# Patient Record
Sex: Female | Born: 1975 | Race: White | Hispanic: No | Marital: Married | Smoking: Never smoker
Health system: Southern US, Community
[De-identification: ages and names within clinical notes are randomized; demographics above are authoritative.]

## PROBLEM LIST (undated history)

## (undated) DIAGNOSIS — I7300 Raynaud's syndrome without gangrene: Secondary | ICD-10-CM

## (undated) DIAGNOSIS — Z87898 Personal history of other specified conditions: Secondary | ICD-10-CM

## (undated) HISTORY — DX: Personal history of other specified conditions: Z87.898

## (undated) HISTORY — PX: COSMETIC SURGERY: SHX468

---

## 2005-06-22 ENCOUNTER — Other Ambulatory Visit: Admission: RE | Admit: 2005-06-22 | Discharge: 2005-06-22 | Payer: Self-pay | Admitting: Obstetrics and Gynecology

## 2005-06-23 ENCOUNTER — Other Ambulatory Visit: Admission: RE | Admit: 2005-06-23 | Discharge: 2005-06-23 | Payer: Self-pay | Admitting: Obstetrics and Gynecology

## 2008-10-11 ENCOUNTER — Inpatient Hospital Stay (HOSPITAL_COMMUNITY): Admission: AD | Admit: 2008-10-11 | Discharge: 2008-10-13 | Payer: Self-pay | Admitting: Obstetrics & Gynecology

## 2010-07-17 ENCOUNTER — Inpatient Hospital Stay (HOSPITAL_COMMUNITY): Admission: AD | Admit: 2010-07-17 | Discharge: 2010-07-19 | Payer: Self-pay | Admitting: Family Medicine

## 2010-12-22 LAB — CBC
HCT: 34 % — ABNORMAL LOW (ref 36.0–46.0)
HCT: 39.5 % (ref 36.0–46.0)
Hemoglobin: 11.7 g/dL — ABNORMAL LOW (ref 12.0–15.0)
Hemoglobin: 13.4 g/dL (ref 12.0–15.0)
MCH: 31 pg (ref 26.0–34.0)
MCH: 31.4 pg (ref 26.0–34.0)
MCHC: 34.3 g/dL (ref 30.0–36.0)
MCV: 91.4 fL (ref 78.0–100.0)
MCV: 91.7 fL (ref 78.0–100.0)
Platelets: 240 10*3/uL (ref 150–400)
RBC: 3.71 MIL/uL — ABNORMAL LOW (ref 3.87–5.11)
RBC: 4.32 MIL/uL (ref 3.87–5.11)

## 2011-01-23 LAB — CBC
HCT: 26.1 % — ABNORMAL LOW (ref 36.0–46.0)
MCHC: 34.8 g/dL (ref 30.0–36.0)
MCHC: 37.7 g/dL — ABNORMAL HIGH (ref 30.0–36.0)
MCV: 92.1 fL (ref 78.0–100.0)
Platelets: 120 10*3/uL — ABNORMAL LOW (ref 150–400)
Platelets: 166 10*3/uL (ref 150–400)
RDW: 13.9 % (ref 11.5–15.5)

## 2011-01-23 LAB — RPR: RPR Ser Ql: NONREACTIVE

## 2013-03-25 ENCOUNTER — Ambulatory Visit: Payer: Self-pay | Admitting: Physician Assistant

## 2015-06-17 ENCOUNTER — Ambulatory Visit (INDEPENDENT_AMBULATORY_CARE_PROVIDER_SITE_OTHER): Payer: Managed Care, Other (non HMO) | Admitting: Family Medicine

## 2015-06-17 VITALS — BP 112/62 | HR 64 | Temp 98.8°F | Resp 18 | Ht 63.5 in | Wt 126.0 lb

## 2015-06-17 DIAGNOSIS — Z20818 Contact with and (suspected) exposure to other bacterial communicable diseases: Secondary | ICD-10-CM

## 2015-06-17 DIAGNOSIS — M791 Myalgia, unspecified site: Secondary | ICD-10-CM

## 2015-06-17 DIAGNOSIS — R509 Fever, unspecified: Secondary | ICD-10-CM | POA: Diagnosis not present

## 2015-06-17 DIAGNOSIS — J029 Acute pharyngitis, unspecified: Secondary | ICD-10-CM

## 2015-06-17 DIAGNOSIS — R350 Frequency of micturition: Secondary | ICD-10-CM

## 2015-06-17 DIAGNOSIS — Z2089 Contact with and (suspected) exposure to other communicable diseases: Secondary | ICD-10-CM

## 2015-06-17 LAB — POCT UA - MICROSCOPIC ONLY
Bacteria, U Microscopic: NEGATIVE
Casts, Ur, LPF, POC: NEGATIVE
Crystals, Ur, HPF, POC: NEGATIVE
Mucus, UA: NEGATIVE
YEAST UA: NEGATIVE

## 2015-06-17 LAB — POCT URINALYSIS DIPSTICK
Bilirubin, UA: NEGATIVE
Glucose, UA: NEGATIVE
Ketones, UA: NEGATIVE
Leukocytes, UA: NEGATIVE
NITRITE UA: NEGATIVE
PH UA: 7
Spec Grav, UA: 1.02
UROBILINOGEN UA: 0.2

## 2015-06-17 LAB — POCT INFLUENZA A/B
INFLUENZA A, POC: NEGATIVE
INFLUENZA B, POC: NEGATIVE

## 2015-06-17 MED ORDER — AMOXICILLIN 875 MG PO TABS: 875.0000 mg | ORAL_TABLET | Freq: Two times a day (BID) | ORAL | Status: DC

## 2015-06-17 NOTE — Patient Instructions (Signed)
probable strep throat with contact and fever/lympadenopathy.  Fluids, lozenges as needed. Ibuprofen for fever/bodyaches. Let me know if you worsen over the weekend - I am in office both days. I will call you about urine and flu tests today.   Return to the clinic or go to the nearest emergency room if any of your symptoms worsen or new symptoms occur.  Fever, Adult A fever is a higher than normal body temperature. In an adult, an oral temperature around 98.6 F (37 C) is considered normal. A temperature of 100.4 F (38 C) or higher is generally considered a fever. Mild or moderate fevers generally have no long-term effects and often do not require treatment. Extreme fever (greater than or equal to 106 F or 41.1 C) can cause seizures. The sweating that may occur with repeated or prolonged fever may cause dehydration. Elderly people can develop confusion during a fever. A measured temperature can vary with:  Age.  Time of day.  Method of measurement (mouth, underarm, rectal, or ear). The fever is confirmed by taking a temperature with a thermometer. Temperatures can be taken different ways. Some methods are accurate and some are not.  An oral temperature is used most commonly. Electronic thermometers are fast and accurate.  An ear temperature will only be accurate if the thermometer is positioned as recommended by the manufacturer.  A rectal temperature is accurate and done for those adults who have a condition where an oral temperature cannot be taken.  An underarm (axillary) temperature is not accurate and not recommended. Fever is a symptom, not a disease.  CAUSES   Infections commonly cause fever.  Some noninfectious causes for fever include:  Some arthritis conditions.  Some thyroid or adrenal gland conditions.  Some immune system conditions.  Some types of cancer.  A medicine reaction.  High doses of certain street drugs such as  methamphetamine.  Dehydration.  Exposure to high outside or room temperatures.  Occasionally, the source of a fever cannot be determined. This is sometimes called a "fever of unknown origin" (FUO).  Some situations may lead to a temporary rise in body temperature that may go away on its own. Examples are:  Childbirth.  Surgery.  Intense exercise. HOME CARE INSTRUCTIONS   Take appropriate medicines for fever. Follow dosing instructions carefully. If you use acetaminophen to reduce the fever, be careful to avoid taking other medicines that also contain acetaminophen. Do not take aspirin for a fever if you are younger than age 33. There is an association with Reye's syndrome. Reye's syndrome is a rare but potentially deadly disease.  If an infection is present and antibiotics have been prescribed, take them as directed. Finish them even if you start to feel better.  Rest as needed.  Maintain an adequate fluid intake. To prevent dehydration during an illness with prolonged or recurrent fever, you may need to drink extra fluid.Drink enough fluids to keep your urine clear or pale yellow.  Sponging or bathing with room temperature water may help reduce body temperature. Do not use ice water or alcohol sponge baths.  Dress comfortably, but do not over-bundle. SEEK MEDICAL CARE IF:   You are unable to keep fluids down.  You develop vomiting or diarrhea.  You are not feeling at least partly better after 3 days.  You develop new symptoms or problems. SEEK IMMEDIATE MEDICAL CARE IF:   You have shortness of breath or trouble breathing.  You develop excessive weakness.  You are dizzy or you  faint.  You are extremely thirsty or you are making little or no urine.  You develop new pain that was not there before (such as in the head, neck, chest, back, or abdomen).  You have persistent vomiting and diarrhea for more than 1 to 2 days.  You develop a stiff neck or your eyes become  sensitive to light.  You develop a skin rash.  You have a fever or persistent symptoms for more than 2 to 3 days.  You have a fever and your symptoms suddenly get worse. MAKE SURE YOU:   Understand these instructions.  Will watch your condition.  Will get help right away if you are not doing well or get worse. Document Released: 03/21/2001 Document Revised: 02/09/2014 Document Reviewed: 07/27/2011 [facility] Patient Information 2015 [place], [place]. This information is not intended to replace advice given to you by your health care provider. Make sure you discuss any questions you have with your health care provider.   Strep Throat Strep throat is an infection of the throat caused by a bacteria named Streptococcus pyogenes. Your health care provider may call the infection streptococcal "tonsillitis" or "pharyngitis" depending on whether there are signs of inflammation in the tonsils or back of the throat. Strep throat is most common in children aged 5-15 years during the cold months of the year, but it can occur in people of any age during any season. This infection is spread from person to person (contagious) through coughing, sneezing, or other close contact. SIGNS AND SYMPTOMS   Fever or chills.  Painful, swollen, red tonsils or throat.  Pain or difficulty when swallowing.  White or yellow spots on the tonsils or throat.  Swollen, tender lymph nodes or "glands" of the neck or under the jaw.  Red rash all over the body (rare). DIAGNOSIS  Many different infections can cause the same symptoms. A test must be done to confirm the diagnosis so the right treatment can be given. A "rapid strep test" can help your health care provider make the diagnosis in a few minutes. If this test is not available, a light swab of the infected area can be used for a throat culture test. If a throat culture test is done, results are usually available in a day or two. TREATMENT  Strep throat is treated  with antibiotic medicine. HOME CARE INSTRUCTIONS   Gargle with 1 tsp of salt in 1 cup of warm water, 3-4 times per day or as needed for comfort.  Family members who also have a sore throat or fever should be tested for strep throat and treated with antibiotics if they have the strep infection.  Make sure everyone in your household washes their hands well.  Do not share food, drinking cups, or personal items that could cause the infection to spread to others.  You may need to eat a soft food diet until your sore throat gets better.  Drink enough water and fluids to keep your urine clear or pale yellow. This will help prevent dehydration.  Get plenty of rest.  Stay home from school, day care, or work until you have been on antibiotics for 24 hours.  Take medicines only as directed by your health care provider.  Take your antibiotic medicine as directed by your health care provider. Finish it even if you start to feel better. SEEK MEDICAL CARE IF:   The glands in your neck continue to enlarge.  You develop a rash, cough, or earache.  You cough up  green, yellow-brown, or bloody sputum.  You have pain or discomfort not controlled by medicines.  Your problems seem to be getting worse rather than better.  You have a fever. SEEK IMMEDIATE MEDICAL CARE IF:   You develop any new symptoms such as vomiting, severe headache, stiff or painful neck, chest pain, shortness of breath, or trouble swallowing.  You develop severe throat pain, drooling, or changes in your voice.  You develop swelling of the neck, or the skin on the neck becomes red and tender.  You develop signs of dehydration, such as fatigue, dry mouth, and decreased urination.  You become increasingly sleepy, or you cannot wake up completely. MAKE SURE YOU:  Understand these instructions.  Will watch your condition.  Will get help right away if you are not doing well or get worse. Document Released: 09/22/2000  Document Revised: 02/09/2014 Document Reviewed: 11/24/2010 [facility] Patient Information 2015 [place], [place]. This information is not intended to replace advice given to you by your health care provider. Make sure you discuss any questions you have with your health care provider.

## 2015-06-17 NOTE — Progress Notes (Addendum)
Subjective:  This chart was scribed for Gabrielle Briggs, M.D. by Broadus John, Medical Scribe. This patient was seen in Room 10  and the patient's care was started at 3:41 PM.   Patient ID: Gabrielle Briggs, female    DOB: 1976/06/03, 39 y.o.   MRN: [id number]  Chief Complaint  Patient presents with  . Fever    102.4 today   . Sore Throat  . Generalized Body Aches  . Urinary Frequency    yesterday    HPI HPI Comments: Gabrielle Briggs is a 39 y.o. female who presents to [facility] complaining of fever, TMAX 101, onset yesterday. Pt reports that she has a worsening myalgia, occasional urinary frequency and urgency past 2 days, sore throat, and difficulty swallowing that is accompanied with ear ache. Pt states that she has taken three doses of Ibuprofen for the symptoms which gave her intermediate relief. She indicates that she does not take any other medications other than birth control. Pt reports that she was camping during [place] last weekend where one of the kids there tested positive for strep yesterday. Pt denies dysuria, hematuria, cough, nausea, vomiting, diarrhea, recent tick bites that she is aware of.  She was camping, but no ticks were pulled off, and would have seen them within a day or 2 if present. No rashes.  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 There are no active problems to display for this patient.  History reviewed. No pertinent past medical history. Past Surgical History  Procedure Laterality Date  . Cosmetic surgery     Allergies  Allergen Reactions  . Sulfa Antibiotics Hives   Prior to Admission medications   Medication Sig Start Date End Date Taking? Authorizing Provider  levonorgestrel-ethinyl estradiol (SEASONALE,INTROVALE,JOLESSA) 0.15-0.03 MG tablet Take 1 tablet by mouth daily.   Yes Historical Provider, MD   Social History   Social  History  . Marital Status: Married    Spouse Name: N/A  . Number of Children: N/A  . Years of Education: N/A   Occupational History  . Not on file.   Social History Main Topics  . Smoking status: Never Smoker   . Smokeless tobacco: Not on file  . Alcohol Use: 1.8 - 2.4 oz/week    3-4 Standard drinks or equivalent per week  . Drug Use: No  . Sexual Activity: Not on file   Other Topics Concern  . Not on file   Social History Narrative  . No narrative on file    Review of Systems  HENT: Positive for ear pain, sore throat and trouble swallowing.   Gastrointestinal: Negative for nausea, vomiting and diarrhea.  Genitourinary: Positive for urgency and frequency. Negative for dysuria and hematuria.  Musculoskeletal: Positive for myalgias.      Objective:   Physical Exam  Constitutional: She is oriented to person, place, and time. She appears well-developed and well-nourished. No distress.  HENT:  Head: Normocephalic and atraumatic.  Right Ear: Hearing, tympanic membrane, external ear and ear canal normal.  Left Ear: Hearing, tympanic membrane, external ear and ear canal normal.  Nose: Nose normal.  Mouth/Throat: Oropharynx is clear and moist. No oropharyngeal exudate.  TM's are pearly grey. minimal erythema of the the tonsil recess, no exudate.   Eyes: Conjunctivae and EOM are normal. Pupils are equal, round, and reactive to light.  Neck: Neck supple.  Cardiovascular: Normal rate, regular  rhythm, normal heart sounds and intact distal pulses.   No murmur heard. Pulmonary/Chest: Effort normal and breath sounds normal. No respiratory distress. She has no wheezes. She has no rhonchi.  Abdominal: There is no CVA tenderness.  Musculoskeletal:  Minimal discomfort on the paraspinal muscles.    Lymphadenopathy:  Tenderness in the Right AC note greater than left.   Neurological: She is alert and oriented to person, place, and time. No cranial nerve deficit.  Skin: Skin is warm and  dry. No rash noted.  Psychiatric: She has a normal mood and affect. Her behavior is normal.  Nursing note and vitals reviewed.   Filed Vitals:   06/17/15 1532  BP: 112/62  Pulse: 64  Temp: 98.8 F (37.1 C)  TempSrc: Oral  Resp: 18  Height: 5' 3.5" (1.613 m)  Weight: 126 lb (57.153 kg)  SpO2: 98%   Results for orders placed or performed in visit on 06/17/15  POCT urinalysis dipstick  Result Value Ref Range   Color, UA yellow    Clarity, UA clear    Glucose, UA neg    Bilirubin, UA neg    Ketones, UA neg    Spec Grav, UA 1.020    Blood, UA moderate    pH, UA 7.0    Protein, UA trace    Urobilinogen, UA 0.2    Nitrite, UA neg    Leukocytes, UA Negative Negative  POCT UA - Microscopic Only  Result Value Ref Range   WBC, Ur, HPF, POC 0-2    RBC, urine, microscopic 4-6    Bacteria, U Microscopic neg    Mucus, UA neg    Epithelial cells, urine per micros 0-4    Crystals, Ur, HPF, POC neg    Casts, Ur, LPF, POC neg    Yeast, UA neg   POCT Influenza A/B  Result Value Ref Range   Influenza A, POC Negative Negative   Influenza B, POC Negative Negative       Assessment & Plan:   Gabrielle Briggs is a 39 y.o. female Fever, unspecified fever cause - Plan: POCT Influenza A/B, amoxicillin (AMOXIL) 875 MG tablet Sore throat - Plan: amoxicillin (AMOXIL) 875 MG tablet Exposure to strep throat - Plan: amoxicillin (AMOXIL) 875 MG tablet Myalgia - Plan: POCT Influenza A/B  -2 day history of myalgias, fever, generalized malaise, with localization to sore throat, raw throat and objective fever. Slight erythema of the posterior oropharynx on exam accompanied by lymphadenopathy and exposure to strep throat. Suspicious enough for strep throat, decided against testing at this time and treating with amoxicillin for likely strep pharyngitis.   -Flu testing performed - negative as above.  -Symptomatic care discussed, return to clinic precautions.  Frequent urination - Plan: POCT urinalysis  dipstick, POCT UA - Microscopic Only  -Suspect increased frequency/urgency with increasing fluid intake during current illness. No true CVA tenderness and abdomen is soft/nontender. Doubt UTI. Discussed results of few RBCs and monitor blood on UA. She is on [place], so has not noticed any intra-menstrual bleeding or spotting. Declined culture or repeat urine testing this time, with plan of repeat UA if persistent urinary symptoms or any gross hematuria.   Meds ordered this encounter  Medications  . levonorgestrel-ethinyl estradiol (SEASONALE,INTROVALE,JOLESSA) 0.15-0.03 MG tablet    Sig: Take 1 tablet by mouth daily.  Marland Kitchen amoxicillin (AMOXIL) 875 MG tablet    Sig: Take 1 tablet (875 mg total) by mouth 2 (two) times daily.    Dispense:  20  tablet    Refill:  0   Patient Instructions  probable strep throat with contact and fever/lympadenopathy.  Fluids, lozenges as needed. Ibuprofen for fever/bodyaches. Let me know if you worsen over the weekend - I am in office both days. I will call you about urine and flu tests today.   Return to the clinic or go to the nearest emergency room if any of your symptoms worsen or new symptoms occur.  Fever, Adult A fever is a higher than normal body temperature. In an adult, an oral temperature around 98.6 F (37 C) is considered normal. A temperature of 100.4 F (38 C) or higher is generally considered a fever. Mild or moderate fevers generally have no long-term effects and often do not require treatment. Extreme fever (greater than or equal to 106 F or 41.1 C) can cause seizures. The sweating that may occur with repeated or prolonged fever may cause dehydration. Elderly people can develop confusion during a fever. A measured temperature can vary with:  Age.  Time of day.  Method of measurement (mouth, underarm, rectal, or ear). The fever is confirmed by taking a temperature with a thermometer. Temperatures can be taken different ways. Some methods are  accurate and some are not.  An oral temperature is used most commonly. Electronic thermometers are fast and accurate.  An ear temperature will only be accurate if the thermometer is positioned as recommended by the manufacturer.  A rectal temperature is accurate and done for those adults who have a condition where an oral temperature cannot be taken.  An underarm (axillary) temperature is not accurate and not recommended. Fever is a symptom, not a disease.  CAUSES   Infections commonly cause fever.  Some noninfectious causes for fever include:  Some arthritis conditions.  Some thyroid or adrenal gland conditions.  Some immune system conditions.  Some types of cancer.  A medicine reaction.  High doses of certain street drugs such as methamphetamine.  Dehydration.  Exposure to high outside or room temperatures.  Occasionally, the source of a fever cannot be determined. This is sometimes called a "fever of unknown origin" (FUO).  Some situations may lead to a temporary rise in body temperature that may go away on its own. Examples are:  Childbirth.  Surgery.  Intense exercise. HOME CARE INSTRUCTIONS   Take appropriate medicines for fever. Follow dosing instructions carefully. If you use acetaminophen to reduce the fever, be careful to avoid taking other medicines that also contain acetaminophen. Do not take aspirin for a fever if you are younger than age 34. There is an association with Reye's syndrome. Reye's syndrome is a rare but potentially deadly disease.  If an infection is present and antibiotics have been prescribed, take them as directed. Finish them even if you start to feel better.  Rest as needed.  Maintain an adequate fluid intake. To prevent dehydration during an illness with prolonged or recurrent fever, you may need to drink extra fluid.Drink enough fluids to keep your urine clear or pale yellow.  Sponging or bathing with room temperature water may  help reduce body temperature. Do not use ice water or alcohol sponge baths.  Dress comfortably, but do not over-bundle. SEEK MEDICAL CARE IF:   You are unable to keep fluids down.  You develop vomiting or diarrhea.  You are not feeling at least partly better after 3 days.  You develop new symptoms or problems. SEEK IMMEDIATE MEDICAL CARE IF:   You have shortness of breath or  trouble breathing.  You develop excessive weakness.  You are dizzy or you faint.  You are extremely thirsty or you are making little or no urine.  You develop new pain that was not there before (such as in the head, neck, chest, back, or abdomen).  You have persistent vomiting and diarrhea for more than 1 to 2 days.  You develop a stiff neck or your eyes become sensitive to light.  You develop a skin rash.  You have a fever or persistent symptoms for more than 2 to 3 days.  You have a fever and your symptoms suddenly get worse. MAKE SURE YOU:   Understand these instructions.  Will watch your condition.  Will get help right away if you are not doing well or get worse. Document Released: 03/21/2001 Document Revised: 02/09/2014 Document Reviewed: 07/27/2011 [facility] Patient Information 2015 [place], [place]. This information is not intended to replace advice given to you by your health care provider. Make sure you discuss any questions you have with your health care provider.   Strep Throat Strep throat is an infection of the throat caused by a bacteria named Streptococcus pyogenes. Your health care provider may call the infection streptococcal "tonsillitis" or "pharyngitis" depending on whether there are signs of inflammation in the tonsils or back of the throat. Strep throat is most common in children aged 5-15 years during the cold months of the year, but it can occur in people of any age during any season. This infection is spread from person to person (contagious) through coughing, sneezing, or other  close contact. SIGNS AND SYMPTOMS   Fever or chills.  Painful, swollen, red tonsils or throat.  Pain or difficulty when swallowing.  White or yellow spots on the tonsils or throat.  Swollen, tender lymph nodes or "glands" of the neck or under the jaw.  Red rash all over the body (rare). DIAGNOSIS  Many different infections can cause the same symptoms. A test must be done to confirm the diagnosis so the right treatment can be given. A "rapid strep test" can help your health care provider make the diagnosis in a few minutes. If this test is not available, a light swab of the infected area can be used for a throat culture test. If a throat culture test is done, results are usually available in a day or two. TREATMENT  Strep throat is treated with antibiotic medicine. HOME CARE INSTRUCTIONS   Gargle with 1 tsp of salt in 1 cup of warm water, 3-4 times per day or as needed for comfort.  Family members who also have a sore throat or fever should be tested for strep throat and treated with antibiotics if they have the strep infection.  Make sure everyone in your household washes their hands well.  Do not share food, drinking cups, or personal items that could cause the infection to spread to others.  You may need to eat a soft food diet until your sore throat gets better.  Drink enough water and fluids to keep your urine clear or pale yellow. This will help prevent dehydration.  Get plenty of rest.  Stay home from school, day care, or work until you have been on antibiotics for 24 hours.  Take medicines only as directed by your health care provider.  Take your antibiotic medicine as directed by your health care provider. Finish it even if you start to feel better. SEEK MEDICAL CARE IF:   The glands in your neck continue to enlarge.  You develop a rash, cough, or earache.  You cough up green, yellow-brown, or bloody sputum.  You have pain or discomfort not controlled by  medicines.  Your problems seem to be getting worse rather than better.  You have a fever. SEEK IMMEDIATE MEDICAL CARE IF:   You develop any new symptoms such as vomiting, severe headache, stiff or painful neck, chest pain, shortness of breath, or trouble swallowing.  You develop severe throat pain, drooling, or changes in your voice.  You develop swelling of the neck, or the skin on the neck becomes red and tender.  You develop signs of dehydration, such as fatigue, dry mouth, and decreased urination.  You become increasingly sleepy, or you cannot wake up completely. MAKE SURE YOU:  Understand these instructions.  Will watch your condition.  Will get help right away if you are not doing well or get worse. Document Released: 09/22/2000 Document Revised: 02/09/2014 Document Reviewed: 11/24/2010 [facility] Patient Information 2015 [place], [place]. This information is not intended to replace advice given to you by your health care provider. Make sure you discuss any questions you have with your health care provider.    I personally performed the services described in this documentation, which was scribed in my presence. The recorded information has been reviewed and considered, and addended by me as needed.

## 2015-06-20 ENCOUNTER — Ambulatory Visit (INDEPENDENT_AMBULATORY_CARE_PROVIDER_SITE_OTHER): Payer: Managed Care, Other (non HMO) | Admitting: Family Medicine

## 2015-06-20 VITALS — BP 108/70 | HR 54 | Temp 98.1°F | Resp 18 | Ht 62.0 in | Wt 125.0 lb

## 2015-06-20 DIAGNOSIS — L539 Erythematous condition, unspecified: Secondary | ICD-10-CM | POA: Diagnosis not present

## 2015-06-20 DIAGNOSIS — B353 Tinea pedis: Secondary | ICD-10-CM

## 2015-06-20 NOTE — Patient Instructions (Signed)
Continue clotrimazole BID, keep area open/dry as possible. Call me if erythema spreading again and I can call in doxycycline.   Athlete's Foot Athlete's foot (tinea pedis) is a fungal infection of the skin on the feet. It often occurs on the skin between the toes or underneath the toes. It can also occur on the soles of the feet. Athlete's foot is more likely to occur in hot, humid weather. Not washing your feet or changing your socks often enough can contribute to athlete's foot. The infection can spread from person to person (contagious). CAUSES Athlete's foot is caused by a fungus. This fungus thrives in warm, moist places. Most people get athlete's foot by sharing shower stalls, towels, and wet floors with an infected person. People with weakened immune systems, including those with diabetes, may be more likely to get athlete's foot. SYMPTOMS   Itchy areas between the toes or on the soles of the feet.  White, flaky, or scaly areas between the toes or on the soles of the feet.  Tiny, intensely itchy blisters between the toes or on the soles of the feet.  Tiny cuts on the skin. These cuts can develop a bacterial infection.  Thick or discolored toenails. DIAGNOSIS  Your caregiver can usually tell what the problem is by doing a physical exam. Your caregiver may also take a skin sample from the rash area. The skin sample may be examined under a microscope, or it may be tested to see if fungus will grow in the sample. A sample may also be taken from your toenail for testing. TREATMENT  Over-the-counter and prescription medicines can be used to kill the fungus. These medicines are available as powders or creams. Your caregiver can suggest medicines for you. Fungal infections respond slowly to treatment. You may need to continue using your medicine for several weeks. PREVENTION   Do not share towels.  Wear sandals in wet areas, such as shared locker rooms and shared showers.  Keep your feet  dry. Wear shoes that allow air to circulate. Wear cotton or wool socks. HOME CARE INSTRUCTIONS   Take medicines as directed by your caregiver. Do not use steroid creams on athlete's foot.  Keep your feet clean and cool. Wash your feet daily and dry them thoroughly, especially between your toes.  Change your socks every day. Wear cotton or wool socks. In hot climates, you may need to change your socks 2 to 3 times per day.  Wear sandals or canvas tennis shoes with good air circulation.  If you have blisters, soak your feet in Burow's solution or Epsom salts for 20 to 30 minutes, 2 times a day to dry out the blisters. Make sure you dry your feet thoroughly afterward. SEEK MEDICAL CARE IF:   You have a fever.  You have swelling, soreness, warmth, or redness in your foot.  You are not getting better after 7 days of treatment.  You are not completely cured after 30 days.  You have any problems caused by your medicines. MAKE SURE YOU:   Understand these instructions.  Will watch your condition.  Will get help right away if you are not doing well or get worse. Document Released: 09/22/2000 Document Revised: 12/18/2011 Document Reviewed: 07/14/2011 [facility] Patient Information 2015 [place], [place]. This information is not intended to replace advice given to you by your health care provider. Make sure you discuss any questions you have with your health care provider.

## 2015-06-20 NOTE — Progress Notes (Signed)
Subjective:  This chart was scribed for Gabrielle Staggers, MD by Andrew Au, ED Scribe. This patient was seen in room 11 and the patient's care was started at 9:40 AM.  Patient ID: Gabrielle Briggs, female    DOB: 1975/12/15, 39 y.o.   MRN: [id number]  HPI   Chief Complaint  Patient presents with  . Nail Problem    left toe redness    HPI Comments: Gabrielle Briggs is a 39 y.o. female who presents to the [facility] complaining of left toe redness. She was seen 3 days ago with fever, myalgias, sore throat. Suspected strep throat. Treated with amoxicillin 875 mg BID. Presents today with left toe redness.   Pt reports she began to notice cracking between 4th and 5th toes [redacted] week along with a wet appearance. States she went camping last week and had worn tennis shoes longer than usual. She developed soreness between the toes, more so to the fifth toe, with some faint redness that began yesterday. She has been keeping area dry and has been applying clotrimazole 2 times a day for the past 2 days. She's had improvement with redness, stating area was more red yesterday than today.   Pt reports improvement with sore throat.   There are no active problems to display for this patient.  History reviewed. No pertinent past medical history. Past Surgical History  Procedure Laterality Date  . Cosmetic surgery     Allergies  Allergen Reactions  . Sulfa Antibiotics Hives   Prior to Admission medications   Medication Sig Start Date End Date Taking? Authorizing Provider  amoxicillin (AMOXIL) 875 MG tablet Take 1 tablet (875 mg total) by mouth 2 (two) times daily. 06/17/15  Yes Shade Flood, MD  levonorgestrel-ethinyl estradiol (SEASONALE,INTROVALE,JOLESSA) 0.15-0.03 MG tablet Take 1 tablet by mouth daily.   Yes Historical Provider, MD   Social History   Social History  . Marital Status: Married    Spouse Name: N/A  . Number of Children: N/A  . Years of Education: N/A   Occupational  History  . Not on file.   Social History Main Topics  . Smoking status: Never Smoker   . Smokeless tobacco: Not on file  . Alcohol Use: 1.8 - 2.4 oz/week    3-4 Standard drinks or equivalent per week  . Drug Use: No  . Sexual Activity: Not on file   Other Topics Concern  . Not on file   Social History Narrative   Review of Systems  Objective:  Physical Exam  Constitutional: She is oriented to person, place, and time. She appears well-developed and well-nourished. No distress.  HENT:  Head: Normocephalic and atraumatic.  Eyes: Conjunctivae and EOM are normal.  Neck: Neck supple.  Cardiovascular: Normal rate.   Pulmonary/Chest: Effort normal.  Musculoskeletal: Normal range of motion.  Neurological: She is alert and oriented to person, place, and time.  Skin: Skin is warm and dry.  Left foot inbetween 4th and 5th skin is raw, cracking, with a wet appearance. Small fissures at the base. There is erythema to dorsal proximal margin. No appreciable erythema or swelling at base or dorsum of the foot.   Psychiatric: She has a normal mood and affect. Her behavior is normal.  Nursing note and vitals reviewed.    Filed Vitals:   06/20/15 0919  BP: 108/70  Pulse: 54  Temp: 98.1 F (36.7 C)  TempSrc: Oral  Resp: 18  Height:  (1.575 m)  Weight: 125 lb (56.7 kg)  SpO2: 98%   Assessment & Plan:  Gabrielle Briggs is a 39 y.o. female Tinea pedis of left foot  Erythema of foot  Tinea pedis of interdigital skin of fourth and fifth toes, with some spread of erythema concerning initially for a secondary cellulitis. However as the erythema has receded from line drawn yesterday to today, decided against oral antibiotic at this time. We'll continue the over-the-counter clotrimazole cream twice a day, try to leave the area open to air to allow drying. If any further spread of erythema, she is to call and I will send in a prescription for doxycycline to cover for secondary bacterial  infection. 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 RTC precautions  No orders of the defined types were placed in this encounter.   Patient Instructions  Continue clotrimazole BID, keep area open/dry as possible. Call me if erythema spreading again and I can call in doxycycline.   Athlete's Foot Athlete's foot (tinea pedis) is a fungal infection of the skin on the feet. It often occurs on the skin between the toes or underneath the toes. It can also occur on the soles of the feet. Athlete's foot is more likely to occur in hot, humid weather. Not washing your feet or changing your socks often enough can contribute to athlete's foot. The infection can spread from person to person (contagious). CAUSES Athlete's foot is caused by a fungus. This fungus thrives in warm, moist places. Most people get athlete's foot by sharing shower stalls, towels, and wet floors with an infected person. People with weakened immune systems, including those with diabetes, may be more likely to get athlete's foot. SYMPTOMS   Itchy areas between the toes or on the soles of the feet.  White, flaky, or scaly areas between the toes or on the soles of the feet.  Tiny, intensely itchy blisters between the toes or on the soles of the feet.  Tiny cuts on the skin. These cuts can develop a bacterial infection.  Thick or discolored toenails. DIAGNOSIS  Your caregiver can usually tell what the problem is by doing a physical exam. Your caregiver may also take a skin sample from the rash area. The skin sample may be examined under a microscope, or it may be tested to see if fungus will grow in the sample. A sample may also be taken from your toenail for testing. TREATMENT  Over-the-counter and prescription medicines can be used to kill the fungus. These medicines are available as powders or creams. Your caregiver can suggest medicines for you. Fungal  infections respond slowly to treatment. You may need to continue using your medicine for several weeks. PREVENTION   Do not share towels.  Wear sandals in wet areas, such as shared locker rooms and shared showers.  Keep your feet dry. Wear shoes that allow air to circulate. Wear cotton or wool socks. HOME CARE INSTRUCTIONS   Take medicines as directed by your caregiver. Do not use steroid creams on athlete's foot.  Keep your feet clean and cool. Wash your feet daily and dry them thoroughly, especially between your toes.  Change your socks every day. Wear cotton or wool socks. In hot climates, you may need to change your socks 2 to 3 times per day.  Wear sandals or canvas tennis shoes with good air circulation.  If you have blisters, soak your feet in  Burow's solution or Epsom salts for 20 to 30 minutes, 2 times a day to dry out the blisters. Make sure you dry your feet thoroughly afterward. SEEK MEDICAL CARE IF:   You have a fever.  You have swelling, soreness, warmth, or redness in your foot.  You are not getting better after 7 days of treatment.  You are not completely cured after 30 days.  You have any problems caused by your medicines. MAKE SURE YOU:   Understand these instructions.  Will watch your condition.  Will get help right away if you are not doing well or get worse. Document Released: 09/22/2000 Document Revised: 12/18/2011 Document Reviewed: 07/14/2011 [facility] Patient Information 2015 [place], [place]. This information is not intended to replace advice given to you by your health care provider. Make sure you discuss any questions you have with your health care provider.     I personally performed the services described in this documentation, which was scribed in my presence. The recorded information has been reviewed and considered, and addended by me as needed.

## 2016-02-28 ENCOUNTER — Ambulatory Visit (INDEPENDENT_AMBULATORY_CARE_PROVIDER_SITE_OTHER): Payer: Managed Care, Other (non HMO) | Admitting: Physician Assistant

## 2016-02-28 VITALS — BP 104/62 | HR 61 | Temp 98.5°F | Resp 16 | Ht 62.0 in | Wt 124.8 lb

## 2016-02-28 DIAGNOSIS — R6889 Other general symptoms and signs: Secondary | ICD-10-CM | POA: Diagnosis not present

## 2016-02-28 LAB — POCT INFLUENZA A/B
INFLUENZA A, POC: NEGATIVE
INFLUENZA B, POC: NEGATIVE

## 2016-02-28 NOTE — Progress Notes (Signed)
   Gabrielle Briggs  MRN: [id number] DOB: 1976-09-08  Subjective:  Pt presents to clinic with 48h of not feeling well.  She wants to make sure she does not have the flu because she has family in town and her children are going out of town this weekend.  She has nasal congestion that started today and a cough that is deep but it is not productive.  She used Delsym last night and that helped the cough from keeping her up.  There are no active problems to display for this patient.   Current Outpatient Prescriptions on File Prior to Visit  Medication Sig Dispense Refill  . levonorgestrel-ethinyl estradiol (SEASONALE,INTROVALE,JOLESSA) 0.15-0.03 MG tablet Take 1 tablet by mouth daily.     No current facility-administered medications on file prior to visit.    Allergies  Allergen Reactions  . Sulfa Antibiotics Hives    Review of Systems  Constitutional: Positive for fever (101.5) and chills.  HENT: Positive for congestion, rhinorrhea and sore throat (irritated).   Respiratory: Positive for cough (productive in the am).   Musculoskeletal: Positive for myalgias.  Neurological: Positive for headaches.   Objective:  BP 104/62 mmHg  Pulse 61  Temp(Src) 98.5 F (36.9 C) (Oral)  Resp 16  Ht 5\' 2"  (1.575 m)  Wt 124 lb 12.8 oz (56.609 kg)  BMI 22.82 kg/m2  SpO2 98%  Physical Exam  Constitutional: She is oriented to person, place, and time and well-developed, well-nourished, and in no distress.  HENT:  Head: Normocephalic and atraumatic.  Right Ear: Hearing, tympanic membrane, external ear and ear canal normal.  Left Ear: Hearing, tympanic membrane, external ear and ear canal normal.  Nose: Nose normal.  Mouth/Throat: Uvula is midline, oropharynx is clear and moist and mucous membranes are normal.  Eyes: Conjunctivae are normal.  Neck: Normal range of motion.  Cardiovascular: Normal rate, regular rhythm and normal heart sounds.   No murmur heard. Pulmonary/Chest: Effort normal and  breath sounds normal. She has no wheezes.  Neurological: She is alert and oriented to person, place, and time. Gait normal.  Skin: Skin is warm and dry.  Psychiatric: Mood, memory, affect and judgment normal.  Vitals reviewed.   Results for orders placed or performed in visit on 02/28/16  POCT Influenza A/B  Result Value Ref Range   Influenza A, POC Negative Negative   Influenza B, POC Negative Negative    Assessment and Plan :  Flu-like symptoms - Plan: POCT Influenza A/B   Symptomatic care - continue  Benny LennertSarah Shanese Riemenschneider PA-C  [facility] 02/28/2016 9:28 AM

## 2018-01-16 ENCOUNTER — Telehealth: Payer: Self-pay

## 2018-01-16 NOTE — Telephone Encounter (Signed)
SENT REFERRAL TO SCHEDULING 

## 2018-03-07 ENCOUNTER — Ambulatory Visit (INDEPENDENT_AMBULATORY_CARE_PROVIDER_SITE_OTHER): Payer: 59 | Admitting: Cardiology

## 2018-03-07 ENCOUNTER — Encounter: Payer: Self-pay | Admitting: Cardiology

## 2018-03-07 VITALS — BP 102/66 | HR 44 | Ht 62.0 in | Wt 125.0 lb

## 2018-03-07 DIAGNOSIS — R011 Cardiac murmur, unspecified: Secondary | ICD-10-CM | POA: Insufficient documentation

## 2018-03-07 DIAGNOSIS — R002 Palpitations: Secondary | ICD-10-CM | POA: Diagnosis not present

## 2018-03-07 DIAGNOSIS — I341 Nonrheumatic mitral (valve) prolapse: Secondary | ICD-10-CM | POA: Diagnosis not present

## 2018-03-07 NOTE — Patient Instructions (Signed)
NO MEDICATION  CHANGES     WILL SCHEDULE AT [address] Your physician has requested that you have an echocardiogram. Echocardiography is a painless test that uses sound waves to create images of your heart. It provides your doctor with information about the size and shape of your heart and how well your heart's chambers and valves are working. This procedure takes approximately one hour. There are no restrictions for this procedure.   AND  SCHEDULE AT [address] Your physician has requested that you have an exercise tolerance test. For further information please visit [email or web address] Please also follow instruction sheet, as given.    Your physician recommends that you schedule a follow-up appointment in 1 MONTH WITH DR HARDING.

## 2018-03-07 NOTE — Progress Notes (Signed)
.  PCP: Milus Height, PA-C  Clinic Note: Chief Complaint  Patient presents with  . New Admit To SNF  . Palpitations    Irregular heartbeats  . Tachycardia    Bursts of tachycardia at end of exercise    HPI: Gabrielle Briggs is a 42 y.o. female who is being seen today for the evaluation of palpitations with history of MVP.  She has been seen at the request of Marlinda Mike, CNM -from [facility] [employer].  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  She was a former patient of Dr. Ritta Slot,  last seen in March-April 2009. => She was being seen for episodes of palpitation.  She was noted to be a lifelong athlete with a resting heart rate in the 40s (had a history of running marathons still running 5 miles  at a time at least 3 days a week).  There is concern that her episodes were potentially related to SVT.  She wore a 10-day monitor.  She also had an echocardiogram that suggested possible mitral valve prolapse.  Recent Hospitalizations: None  Studies Personally Reviewed - (if available, images/films reviewed: From Epic Chart or Care Everywhere)  Echocardiogram from 2009 not available   Interval History: Gabrielle Briggs has pretty much been doing fairly well and not having that much you have any significant palpitations since 2009.  She has a resting baseline heart rate in the 40s probably due to her distance running history.  She is still very active, but not doing the same level of activity that she had been doing before.  She still drinks her coffee has not had any change in that. She has not had any problems at all with dyspnea or chest discomfort.  No syncope or near syncope. The main complaint she has a that she has been having her intermittent palpitation symptoms but there was one episode lasting about pretty much the entire weekend last month where she just felt her heart not beating right.  She felt that it was a muscle pounding not fast but forceful heartbeat.  It  was not at all associated with dyspnea or any dizziness.  Just an unusual sensation. The other thing she notes is that she has no problem getting her heart rate up quite high, but will note unlike her exercise partners, she will will have a significant burst and heart rate near the end of exercise that will get her heart rate up into the 180s much faster than usual.  This is a relatively new thing for her and somewhat concerning.  No chest pain or shortness of breath with rest or exertion. No PND, orthopnea or edema. No syncope/near syncope. No TIA/amaurosis fugax symptoms. No melena, hematochezia, hematuria, or epstaxis. No claudication.  ROS: A comprehensive was performed. Review of Systems  Constitutional: Negative for malaise/fatigue and weight loss.  HENT: Negative for congestion and nosebleeds.   Respiratory: Negative for cough and shortness of breath.   Gastrointestinal: Negative for blood in stool and melena.  Genitourinary: Negative for hematuria.  Musculoskeletal: Negative for joint pain.  Psychiatric/Behavioral: The patient is not nervous/anxious.   All other systems reviewed and are negative.  I have reviewed and (if needed) personally updated the patient's problem list, medications, allergies, past medical and surgical history, social and family history.   Past Medical History:  Diagnosis Date  . History of palpitations    Thought to be potentially related to mitral valve prolapse    Past Surgical History:  Procedure Laterality Date  . COSMETIC SURGERY      Current Meds  Medication Sig  . levonorgestrel-ethinyl estradiol (SEASONALE,INTROVALE,JOLESSA) 0.15-0.03 MG tablet Take 1 tablet by mouth daily.    Allergies  Allergen Reactions  . Sulfa Antibiotics Hives    Social History   Tobacco Use  . Smoking status: Never Smoker  . Smokeless tobacco: Never Used  Substance Use Topics  . Alcohol use: Yes    Alcohol/week: 1.8 - 2.4 oz    Types: 3 - 4 Standard  drinks or equivalent per week  . Drug use: No   Social History   Social History Narrative   She is married.  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  Drinks maybe 3 to 4 glasses of wine a week.  Does not smoke.   Former marathon runner.  Still exercises but 4 days a week doing somewhat less strenuous exercise.   Roughly 2 coffee drinks per day.    family history includes Cancer in her father and maternal grandmother; Diabetes in her maternal grandfather and mother; Heart disease in her father and paternal grandfather; Hyperlipidemia in her father, mother, paternal grandfather, and paternal grandmother; Hypertension in her father, mother, paternal grandfather, and paternal grandmother; Stroke in her maternal grandfather.  Wt Readings from Last 3 Encounters:  03/07/18 125 lb (56.7 kg)  02/28/16 124 lb 12.8 oz (56.6 kg)  06/20/15 125 lb (56.7 kg)    PHYSICAL EXAM BP 102/66   Pulse (!) 44   Ht  (1.575 m)   Wt 125 lb (56.7 kg)   BMI 22.86 kg/m  Physical Exam  Constitutional: She is oriented to person, place, and time. She appears well-developed and well-nourished. No distress.  Healthy-appearing.  Very pleasant woman who appears younger than her stated age.  HENT:  Head: Normocephalic and atraumatic.  Mouth/Throat: No oropharyngeal exudate.  Eyes: Pupils are equal, round, and reactive to light. Conjunctivae and EOM are normal. No scleral icterus.  Neck: Normal range of motion. Neck supple. No hepatojugular reflux and no JVD present. Carotid bruit is not present. No tracheal deviation present. No thyromegaly present.  Cardiovascular: Regular rhythm and normal pulses.  No extrasystoles are present. Bradycardia present. PMI is not displaced. Exam reveals no gallop and no friction rub.  Murmur (Soft SEM at RUSB) heard. No obvious click  Pulmonary/Chest: Effort normal and breath sounds normal. No respiratory distress. She has no wheezes. She has no rales.  Abdominal: Soft. Bowel sounds are  normal. She exhibits no distension. There is no tenderness. There is no rebound.  Musculoskeletal: Normal range of motion. She exhibits no edema or deformity.  Neurological: She is alert and oriented to person, place, and time. No cranial nerve deficit.  Skin: Skin is warm and dry. No rash noted. No erythema.  Psychiatric: She has a normal mood and affect. Her behavior is normal. Judgment and thought content normal.  Vitals reviewed.    Adult ECG Report  Rate: 44 ;  Rhythm: sinus bradycardia and Otherwise normal axis, intervals and durations;   Narrative Interpretation: Relatively normal EKG with exception of bradycardia   Other studies Reviewed: Additional studies/ records that were reviewed today include:  Recent Labs: None available  ASSESSMENT / PLAN: Problem List Items Addressed This Visit    Systolic murmur    Soft systolic ejection murmur which may very well simply be a flow murmur.  Need to exclude valvular disease especially with someone you has history of mitral prolapse.  This could actually be  MR murmur. Plan: Check 2D echo      Relevant Orders   ECHOCARDIOGRAM COMPLETE   MVP (mitral valve prolapse)    She was given a diagnosis of mitral prolapse back in 2000 05-2008 timeframe.  Unfortunately do not have an echocardiogram as it is in the long-term storage with paper charts. Regardless, it may well of been a misdiagnosis. Plan: We will check a 2D echocardiogram just to determine if she truly has mitral prolapse.      Relevant Orders   ECHOCARDIOGRAM COMPLETE   EXERCISE TOLERANCE TEST (ETT)   Intermittent palpitations - Primary    She is having intermittent episodes of palpitations but this is not what concerned her and not sure what happened over the weekend where she had the pounding heart rates.  We will recommend that she tries using a smart phone application called Kardia by [employer] to check her heart rhythm (better than using an Event Monitor).  Rapid heartbeats  at the end of exercise are probably related to just some age-related deconditioning and her backing off on the extent of exercise.  We will ensure there is no arrhythmia at the end of exercise by having her do a GXT pushing into the level of full exertion (if it means having to run).  We will also check 2D echocardiogram      Relevant Orders   ECHOCARDIOGRAM COMPLETE   EXERCISE TOLERANCE TEST (ETT)      Current medicines are reviewed at length with the patient today.  (+/- concerns) n/a The following changes have been made:  n/a  Patient Instructions  NO MEDICATION  CHANGES     WILL SCHEDULE AT [address] Your physician has requested that you have an echocardiogram. Echocardiography is a painless test that uses sound waves to create images of your heart. It provides your doctor with information about the size and shape of your heart and how well your heart's chambers and valves are working. This procedure takes approximately one hour. There are no restrictions for this procedure.   AND  SCHEDULE AT [address] Your physician has requested that you have an exercise tolerance test. For further information please visit [email or web address] Please also follow instruction sheet, as given.    Your physician recommends that you schedule a follow-up appointment in 1 MONTH WITH DR Tay Whitwell.     Studies Ordered:   Orders Placed This Encounter  Procedures  . EXERCISE TOLERANCE TEST (ETT)  . ECHOCARDIOGRAM COMPLETE      Bryan Lemma, M.D., M.S. Interventional Cardiologist   Pager # [phone] Phone # [phone] [address] [place], [place] [address]   Thank you for choosing [facility]!!

## 2018-03-10 ENCOUNTER — Encounter: Payer: Self-pay | Admitting: Cardiology

## 2018-03-10 NOTE — Assessment & Plan Note (Signed)
She was given a diagnosis of mitral prolapse back in 2000 05-2008 timeframe.  Unfortunately do not have an echocardiogram as it is in the long-term storage with paper charts. Regardless, it may well of been a misdiagnosis. Plan: We will check a 2D echocardiogram just to determine if she truly has mitral prolapse.

## 2018-03-10 NOTE — Assessment & Plan Note (Signed)
She is having intermittent episodes of palpitations but this is not what concerned her and not sure what happened over the weekend where she had the pounding heart rates.  We will recommend that she tries using a smart phone application called Kardia by [employer] to check her heart rhythm (better than using an Event Monitor).  Rapid heartbeats at the end of exercise are probably related to just some age-related deconditioning and her backing off on the extent of exercise.  We will ensure there is no arrhythmia at the end of exercise by having her do a GXT pushing into the level of full exertion (if it means having to run).  We will also check 2D echocardiogram

## 2018-03-10 NOTE — Assessment & Plan Note (Signed)
Soft systolic ejection murmur which may very well simply be a flow murmur.  Need to exclude valvular disease especially with someone you has history of mitral prolapse.  This could actually be MR murmur. Plan: Check 2D echo

## 2018-03-26 NOTE — Addendum Note (Signed)
Addended by: Choya Tornow W on: 03/26/2018 01:40 PM   Modules accepted: Orders  

## 2018-03-28 ENCOUNTER — Ambulatory Visit (HOSPITAL_COMMUNITY): Payer: 59 | Attending: Cardiology

## 2018-03-28 ENCOUNTER — Other Ambulatory Visit: Payer: Self-pay

## 2018-03-28 DIAGNOSIS — I351 Nonrheumatic aortic (valve) insufficiency: Secondary | ICD-10-CM | POA: Insufficient documentation

## 2018-03-28 DIAGNOSIS — R011 Cardiac murmur, unspecified: Secondary | ICD-10-CM | POA: Diagnosis present

## 2018-03-28 DIAGNOSIS — R002 Palpitations: Secondary | ICD-10-CM | POA: Diagnosis present

## 2018-03-28 DIAGNOSIS — I341 Nonrheumatic mitral (valve) prolapse: Secondary | ICD-10-CM | POA: Diagnosis not present

## 2018-03-29 ENCOUNTER — Telehealth: Payer: Self-pay | Admitting: Cardiology

## 2018-03-29 NOTE — Telephone Encounter (Signed)
Walk In pt Form-pt dropped off [facility] for Dr.Harding. I have sent interoffice to [facility] office.

## 2018-04-10 ENCOUNTER — Telehealth (HOSPITAL_COMMUNITY): Payer: Self-pay

## 2018-04-10 NOTE — Telephone Encounter (Signed)
Encounter complete. 

## 2018-04-16 ENCOUNTER — Ambulatory Visit (HOSPITAL_COMMUNITY): Admission: RE | Admit: 2018-04-16 | Payer: 59 | Source: Ambulatory Visit

## 2018-04-23 ENCOUNTER — Ambulatory Visit: Payer: 59 | Admitting: Cardiology

## 2018-08-14 ENCOUNTER — Emergency Department (HOSPITAL_COMMUNITY): Payer: 59

## 2018-08-14 ENCOUNTER — Emergency Department (HOSPITAL_COMMUNITY)
Admission: EM | Admit: 2018-08-14 | Discharge: 2018-08-15 | Disposition: A | Discharge status: Home / Self Care | Payer: 59 | Attending: Emergency Medicine | Admitting: Emergency Medicine

## 2018-08-14 ENCOUNTER — Encounter (HOSPITAL_COMMUNITY): Payer: Self-pay

## 2018-08-14 ENCOUNTER — Other Ambulatory Visit: Payer: Self-pay

## 2018-08-14 DIAGNOSIS — I341 Nonrheumatic mitral (valve) prolapse: Secondary | ICD-10-CM | POA: Insufficient documentation

## 2018-08-14 DIAGNOSIS — R079 Chest pain, unspecified: Secondary | ICD-10-CM | POA: Diagnosis present

## 2018-08-14 DIAGNOSIS — R0789 Other chest pain: Secondary | ICD-10-CM | POA: Diagnosis not present

## 2018-08-14 DIAGNOSIS — R001 Bradycardia, unspecified: Secondary | ICD-10-CM | POA: Insufficient documentation

## 2018-08-14 HISTORY — DX: Raynaud's syndrome without gangrene: I73.00

## 2018-08-14 LAB — I-STAT BETA HCG BLOOD, ED (NOT ORDERABLE): I-stat hCG, quantitative: <5 m[IU]/mL (ref <5)

## 2018-08-14 LAB — POCT I-STAT TROPONIN I: Troponin i, poc: 0 ng/mL (ref 0.00–0.08)

## 2018-08-14 LAB — BASIC METABOLIC PANEL
Anion gap: 7 (ref 5–15)
BUN: 18 mg/dL (ref 6–20)
CHLORIDE: 104 mmol/L (ref 98–111)
CO2: 27 mmol/L (ref 22–32)
Calcium: 9.7 mg/dL (ref 8.9–10.3)
Creatinine, Ser: 0.88 mg/dL (ref 0.44–1.00)
GFR calc Af Amer: >60 mL/min (ref >60)
GFR calc non Af Amer: >60 mL/min (ref >60)
Glucose, Bld: 117 mg/dL — ABNORMAL HIGH (ref 70–99)
Potassium: 3.5 mmol/L (ref 3.5–5.1)
SODIUM: 138 mmol/L (ref 135–145)

## 2018-08-14 LAB — CBC
HEMATOCRIT: 39.6 % (ref 36.0–46.0)
Hemoglobin: 12.7 g/dL (ref 12.0–15.0)
MCH: 31.4 pg (ref 26.0–34.0)
MCHC: 32.1 g/dL (ref 30.0–36.0)
MCV: 98 fL (ref 80.0–100.0)
Platelets: 262 10*3/uL (ref 150–400)
RBC: 4.04 MIL/uL (ref 3.87–5.11)
RDW: 12.6 % (ref 11.5–15.5)
WBC: 10.8 10*3/uL — AB (ref 4.0–10.5)
nRBC: 0 % (ref 0.0–0.2)

## 2018-08-14 MED ORDER — NITROGLYCERIN 0.4 MG SL SUBL
0.4000 mg | SUBLINGUAL_TABLET | SUBLINGUAL | Status: DC | PRN
  Administered 2018-08-15: 0.4 mg via SUBLINGUAL
  Filled 2018-08-14: qty 1

## 2018-08-14 NOTE — ED Provider Notes (Signed)
[facility] Provider Note: Lowella Dell, MD, FACEP  CSN: [id number] MRN: [id number] ARRIVAL: 08/14/18 at 2126 ROOM: WA07/WA07   CHIEF COMPLAINT  Chest Pain   HISTORY OF PRESENT ILLNESS  08/14/18 11:40 PM Gabrielle Briggs is a 42 y.o. female with a history of mitral valve prolapse, bradycardia, which she attributes to an athletic lifestyle, and PVCs.  She is here with a 2-day history of chest pain.  The chest pain is located in her right upper chest.  It is fairly well localized and is described as a squeezing or spasm-like sensation.  The pain comes and goes.  She currently rates it as a 3 out of 10 but it is been an a 7 out of 10 at times.  Nothing makes the pain better or worse.  It does not change with exertion or with deep breathing.  She is not short of breath.  She has had nausea but no vomiting.   Past Medical History:  Diagnosis Date  . History of palpitations    Thought to be potentially related to mitral valve prolapse  . Raynaud disease     Past Surgical History:  Procedure Laterality Date  . COSMETIC SURGERY      Family History  Problem Relation Age of Onset  . Diabetes Mother   . Hyperlipidemia Mother   . Hypertension Mother   . Cancer Father   . Heart disease Father   . Hyperlipidemia Father   . Hypertension Father   . Cancer Maternal Grandmother   . Diabetes Maternal Grandfather   . Stroke Maternal Grandfather   . Hyperlipidemia Paternal Grandmother   . Hypertension Paternal Grandmother   . Hypertension Paternal Grandfather   . Hyperlipidemia Paternal Grandfather   . Heart disease Paternal Grandfather     Social History   Tobacco Use  . Smoking status: Never Smoker  . Smokeless tobacco: Never Used  Substance Use Topics  . Alcohol use: Yes    Alcohol/week: 3.0 - 4.0 standard drinks    Types: 3 - 4 Standard drinks or equivalent per week  . Drug use: No    Prior to Admission medications   Medication Sig Start Date End Date Taking?  Authorizing Provider  levonorgestrel-ethinyl estradiol (SEASONALE,INTROVALE,JOLESSA) 0.15-0.03 MG tablet Take 1 tablet by mouth daily.    [provider]    Allergies Sulfa antibiotics   REVIEW OF SYSTEMS  Negative except as noted here or in the History of Present Illness.   PHYSICAL EXAMINATION  Initial Vital Signs Blood pressure (!) 142/87, pulse (!) 45, temperature (!) 97.5 F (36.4 C), temperature source Oral, resp. rate 14, height 5\' 2"  (1.575 m), weight 54.4 kg, SpO2 100 %.  Examination General: Well-developed, well-nourished female in no acute distress; appearance consistent with age of record HENT: normocephalic; atraumatic Eyes: pupils equal, round and reactive to light; extraocular muscles intact Neck: supple Heart: regular rate and rhythm; bradycardia; occasional PVCs; monitor shows short PR interval Lungs: clear to auscultation bilaterally Chest: Nontender Abdomen: soft; nondistended; nontender; no masses or hepatosplenomegaly; bowel sounds present Extremities: No deformity; full range of motion; pulses normal; no edema Neurologic: Awake, alert and oriented; motor function intact in all extremities and symmetric; no facial droop Skin: Warm and dry Psychiatric: Normal mood and affect   RESULTS  Summary of this visit's results, reviewed by myself:   EKG Interpretation  Date/Time:  Wednesday August 14 2018 21:34:39 EST Ventricular Rate:  46 PR Interval:    QRS Duration: 95 QT Interval:  462 QTC Calculation: 405 R Axis:   99 Text Interpretation:  Undetermined rhythm Ventricular premature complex Left ventricular hypertrophy Anterior infarct, old No previous ECGs available Confirmed by Armstrong Creasy ([id number]) on 08/14/2018 11:40:23 PM      Laboratory Studies: Results for orders placed or performed during the hospital encounter of 08/14/18 (from the past 24 hour(s))  Basic metabolic panel     Status: Abnormal   Collection Time: 08/14/18 10:12 PM    Result Value Ref Range   Sodium 138 135 - 145 mmol/L   Potassium 3.5 3.5 - 5.1 mmol/L   Chloride 104 98 - 111 mmol/L   CO2 27 22 - 32 mmol/L   Glucose, Bld 117 (H) 70 - 99 mg/dL   BUN 18 6 - 20 mg/dL   Creatinine, Ser 6.04 0.44 - 1.00 mg/dL   Calcium 9.7 8.9 - 54.0 mg/dL   GFR calc non Af Amer >60 >60 mL/min   GFR calc Af Amer >60 >60 mL/min   Anion gap 7 5 - 15  CBC     Status: Abnormal   Collection Time: 08/14/18 10:12 PM  Result Value Ref Range   WBC 10.8 (H) 4.0 - 10.5 K/uL   RBC 4.04 3.87 - 5.11 MIL/uL   Hemoglobin 12.7 12.0 - 15.0 g/dL   HCT 98.1 19.1 - 47.8 %   MCV 98.0 80.0 - 100.0 fL   MCH 31.4 26.0 - 34.0 pg   MCHC 32.1 30.0 - 36.0 g/dL   RDW 29.5 62.1 - 30.8 %   Platelets 262 150 - 400 K/uL   nRBC 0.0 0.0 - 0.2 %  I-Stat beta hCG blood, ED     Status: None   Collection Time: 08/14/18 10:17 PM  Result Value Ref Range   I-stat hCG, quantitative <5.0 <5 mIU/mL   Comment 3          POCT i-Stat troponin I     Status: None   Collection Time: 08/14/18 10:18 PM  Result Value Ref Range   Troponin i, poc 0.00 0.00 - 0.08 ng/mL   Comment 3          POCT i-Stat troponin I     Status: None   Collection Time: 08/15/18  1:27 AM  Result Value Ref Range   Troponin i, poc 0.00 0.00 - 0.08 ng/mL   Comment 3           Imaging Studies: Dg Chest 2 View  Result Date: 08/14/2018 CLINICAL DATA:  Chest pain EXAM: CHEST - 2 VIEW COMPARISON:  None. FINDINGS: The heart size and mediastinal contours are within normal limits. Both lungs are clear. The visualized skeletal structures are unremarkable. IMPRESSION: Clear lungs. Electronically Signed   By: Deatra Robinson M.D.   On: 08/14/2018 22:14    ED COURSE and MDM  Nursing notes and initial vitals signs, including pulse oximetry, reviewed.  Vitals:   08/14/18 2130 08/14/18 2147 08/15/18 0028 08/15/18 0257  BP: (!) 158/87 (!) 142/87 114/71 116/77  Pulse: (!) 45  (!) 43 (!) 47  Resp: 14  14 19   Temp: (!) 97.5 F (36.4 C)      TempSrc: Oral     SpO2: 100%  100% 100%  Weight: 54.4 kg     Height: 5\' 2"  (1.575 m)      1:15 AM No change in pain with nitroglycerin but patient now acknowledges she can reproduce the pain with palpation.  There is tenderness of the right upper chest on  palpation.  She states she did get some relief earlier with ibuprofen and is willing to try Toradol.   2:58 AM Pain relief with IV Toradol.  Some mild tenderness persists.  Patient advised of sinus bradycardia with short PR.  She has a cardiologist, Dr. Herbie Baltimore, with whom she intends to follow-up.  She was advised to treat the pain with over-the-counter NSAIDs.  PROCEDURES    ED DIAGNOSES     ICD-10-CM   1. Chest wall pain R07.89        Paula Libra, MD 08/15/18 [phone]

## 2018-08-14 NOTE — ED Triage Notes (Signed)
Pt is alert and oriented x 4 and is verbally responsive. Pt reports that she has RT side chest pain 5/10 pressure/ tightness. Pt states that the onset occurred yesterday. Pt is escorted with spouse.

## 2018-08-15 LAB — POCT I-STAT TROPONIN I: TROPONIN I, POC: 0 ng/mL (ref 0.00–0.08)

## 2018-08-15 MED ORDER — KETOROLAC TROMETHAMINE 15 MG/ML IJ SOLN
15.0000 mg | Freq: Once | INTRAMUSCULAR | Status: AC
  Administered 2018-08-15: 15 mg via INTRAVENOUS
  Filled 2018-08-15: qty 1

## 2018-08-15 MED ORDER — ONDANSETRON HCL 4 MG/2ML IJ SOLN
4.0000 mg | Freq: Once | INTRAMUSCULAR | Status: AC
  Administered 2018-08-15: 4 mg via INTRAVENOUS
  Filled 2018-08-15: qty 2

## 2018-08-15 NOTE — ED Notes (Signed)
Rates pain at 3 before and after NTG

## 2018-08-19 ENCOUNTER — Encounter: Payer: Self-pay | Admitting: Cardiology

## 2018-08-19 ENCOUNTER — Ambulatory Visit (INDEPENDENT_AMBULATORY_CARE_PROVIDER_SITE_OTHER): Payer: 59 | Admitting: Cardiology

## 2018-08-19 VITALS — BP 112/66 | HR 46 | Ht 62.0 in | Wt 121.8 lb

## 2018-08-19 DIAGNOSIS — I341 Nonrheumatic mitral (valve) prolapse: Secondary | ICD-10-CM | POA: Diagnosis not present

## 2018-08-19 DIAGNOSIS — R002 Palpitations: Secondary | ICD-10-CM | POA: Diagnosis not present

## 2018-08-19 DIAGNOSIS — R000 Tachycardia, unspecified: Secondary | ICD-10-CM

## 2018-08-19 NOTE — Progress Notes (Signed)
.  PCP: Milus Height, PA-C  Clinic Note: Chief Complaint  Patient presents with  . Follow-up    Still has exercise related tachycardia    HPI: Gabrielle Briggs is a 42 y.o. female who is being seen today for the evaluation of palpitations with history of MVP.  She has been seen at the request of Redmon, Noelle, PA-C -from [facility].  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  She was a former patient of Gabrielle Briggs,  last seen in March-April 2009. => She was being seen for episodes of palpitation.  She was noted to be a lifelong athlete with a resting heart rate in the 40s (had a history of running marathons still running 5 miles  at a time at least 3 days a week).  There is concern that her episodes were potentially related to SVT.  She wore a 10-day monitor.  She also had an echocardiogram that suggested possible mitral valve prolapse.  Recent Hospitalizations: None  Studies Personally Reviewed - (if available, images/films reviewed: From Epic Chart or Care Everywhere)  Echocardiogram 6/20: Normal Echo.  Normal LVEF 60-65%.  No RWMA. Normal Diastolic fxn.  No valve disease.   Interval History: [name] returns today overall doing fairly well.  She still notes that her heart rate will go up quite a bit when she is exercising.  She never did get the stress test done because of her trip to [place].  Now that she is back, she is trying to get back into her exercise routine and is noticing the palpitations again with exercise.  Otherwise she is not really having any exertional dyspnea or chest pain/pressure.  No resting palpitations.  She does state that the palpitation episodes have gotten much better since she has been being more cognizant of her hydration. Despite having the fast heart rates, all she notes a little bit of dyspnea, no syncope or near syncope type symptoms.  No TIA or amaurosis fugax.   ROS: A comprehensive was performed. Review of Systems    Constitutional: Negative for malaise/fatigue and weight loss.  HENT: Negative for congestion and nosebleeds.   Respiratory: Negative for cough and shortness of breath.   Gastrointestinal: Negative for blood in stool and melena.  Genitourinary: Negative for hematuria.  Musculoskeletal: Negative for joint pain.  Neurological: Negative for dizziness.   I have reviewed and (if needed) personally updated the patient's problem list, medications, allergies, past medical and surgical history, social and family history.   Past Medical History:  Diagnosis Date  . History of palpitations    Thought to be potentially related to mitral valve prolapse  . Raynaud disease     Past Surgical History:  Procedure Laterality Date  . COSMETIC SURGERY      Current Meds  Medication Sig  . levonorgestrel-ethinyl estradiol (SEASONALE,INTROVALE,JOLESSA) 0.15-0.03 MG tablet Take 1 tablet by mouth daily.    Allergies  Allergen Reactions  . Sulfa Antibiotics Hives    Social History   Tobacco Use  . Smoking status: Never Smoker  . Smokeless tobacco: Never Used  Substance Use Topics  . Alcohol use: Yes    Alcohol/week: 3.0 - 4.0 standard drinks    Types: 3 - 4 Standard drinks or equivalent per week  . Drug use: No   Social History   Social History Narrative   She is married.  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  Drinks maybe 3 to 4 glasses of wine a week.  Does not smoke.   Former marathon runner.  Still exercises but 4 days a week doing somewhat less strenuous exercise.   Roughly 2 coffee drinks per day.    family history includes Cancer in her father and maternal grandmother; Diabetes in her maternal grandfather and mother; Heart disease in her father and paternal grandfather; Hyperlipidemia in her father, mother, paternal grandfather, and paternal grandmother; Hypertension in her father, mother, paternal grandfather, and paternal grandmother; Stroke in her maternal grandfather.  Wt Readings from  Last 3 Encounters:  08/19/18 121 lb 12.8 oz (55.2 kg)  08/14/18 120 lb (54.4 kg)  03/07/18 125 lb (56.7 kg)    PHYSICAL EXAM BP 112/66   Pulse (!) 46   Ht 5\' 2"  (1.575 m)   Wt 121 lb 12.8 oz (55.2 kg)   BMI 22.28 kg/m  Physical Exam  Constitutional: She is oriented to person, place, and time. She appears well-developed and well-nourished. No distress.  Healthy-appearing.  Very pleasant woman who appears younger than her stated age.  HENT:  Head: Normocephalic and atraumatic.  Neck: Normal range of motion. Neck supple. No hepatojugular reflux and no JVD present. Carotid bruit is not present.  Cardiovascular: Regular rhythm, intact distal pulses and normal pulses.  No extrasystoles are present. Bradycardia present. PMI is not displaced. Exam reveals no gallop, no friction rub, no midsystolic click and no opening snap.  Murmur (Soft SEM at RUSB) heard. Pulmonary/Chest: Effort normal and breath sounds normal. No respiratory distress. She has no wheezes. She has no rales.  Abdominal: Soft. Bowel sounds are normal. She exhibits no distension. There is no tenderness. There is no rebound.  Musculoskeletal: Normal range of motion. She exhibits no edema or deformity.  Neurological: She is alert and oriented to person, place, and time.  Psychiatric: She has a normal mood and affect. Her behavior is normal. Judgment and thought content normal.  Vitals reviewed.    Adult ECG Report n/a  Other studies Reviewed: Additional studies/ records that were reviewed today include:  Recent Labs: None available  ASSESSMENT / PLAN: Problem List Items Addressed This Visit    Exercise-induced tachycardia - Primary    Since most of her symptoms are associated with exertion, we will need to trigger this.  She says that walking on incline should increase her heart rate enough that we could trigger something.  We will therefore do a GXT with the intention of her pushing it to the point of always having to  run.  I would like to see if there is any arrhythmia or simply just tachycardia.  Next we talked about treatment options and I think really adequate hydration and maintaining endurance is probably the best treatment in order to avoid medications.  Her resting bradycardia would make treatment difficult.      Relevant Orders   EXERCISE TOLERANCE TEST (ETT)   Intermittent palpitations    Mostly, she notes her palpitations occur with rapid heart rates during exercise.  We talked about wearing a monitor, I think probably the better option will be to do a treadmill GXT to see what happens to her heart rate with peak exercise.  I suspect that this may be deconditioning related along with dehydration.  She says overall her symptoms have improved with increasing her hydration.      Relevant Orders   EXERCISE TOLERANCE TEST (ETT)   MVP (mitral valve prolapse)    This was a diagnosis by report, but most recent echocardiogram does not show any evidence  of mitral valve prolapse.         Current medicines are reviewed at length with the patient today.  (+/- concerns) n/a The following changes have been made:  n/a  Patient Instructions  Medication Instructions:  NOT NEEDED  If you need a refill on your cardiac medications before your next appointment, please call your pharmacy.   Lab work: NOT M=NEEDED If you have labs (blood work) drawn today and your tests are completely normal, you will receive your results only by: Marland Kitchen MyChart Message (if you have MyChart) OR . A paper copy in the mail If you have any lab test that is abnormal or we need to change your treatment, we will call you to review the results.  Testing/Procedures: SCHEDULE AT [address] Your physician has requested that you have an exercise tolerance test. For further information please visit [email or web address] Please also follow instruction sheet, as given.    Follow-Up: At [facility], you and your  health needs are our priority.  As part of our continuing mission to provide you with exceptional heart care, we have created designated Provider Care Teams.  These Care Teams include your primary Cardiologist (physician) and Advanced Practice Providers (APPs -  Physician Assistants and Nurse Practitioners) who all work together to provide you with the care you need, when you need it. . Your physician recommends that you schedule a follow-up appointment in 1 TO 2 MONTHS WITH DR HARDING. .   Any Other Special Instructions Will Be Listed Below (If Applicable).    Studies Ordered:   Orders Placed This Encounter  Procedures  . EXERCISE TOLERANCE TEST (ETT)      Bryan Lemma, M.D., M.S. Interventional Cardiologist   Pager # [phone] Phone # [phone] [address] [place], [place] [address]   Thank you for choosing Heartcare at [facility]!!

## 2018-08-19 NOTE — Assessment & Plan Note (Signed)
Since most of her symptoms are associated with exertion, we will need to trigger this.  She says that walking on incline should increase her heart rate enough that we could trigger something.  We will therefore do a GXT with the intention of her pushing it to the point of always having to run.  I would like to see if there is any arrhythmia or simply just tachycardia.  Next we talked about treatment options and I think really adequate hydration and maintaining endurance is probably the best treatment in order to avoid medications.  Her resting bradycardia would make treatment difficult.

## 2018-08-19 NOTE — Assessment & Plan Note (Signed)
This was a diagnosis by report, but most recent echocardiogram does not show any evidence of mitral valve prolapse.

## 2018-08-19 NOTE — Patient Instructions (Signed)
Medication Instructions:  NOT NEEDED  If you need a refill on your cardiac medications before your next appointment, please call your pharmacy.   Lab work: NOT M=NEEDED If you have labs (blood work) drawn today and your tests are completely normal, you will receive your results only by: Marland Kitchen MyChart Message (if you have MyChart) OR . A paper copy in the mail If you have any lab test that is abnormal or we need to change your treatment, we will call you to review the results.  Testing/Procedures: SCHEDULE AT [address] Your physician has requested that you have an exercise tolerance test. For further information please visit [email or web address] Please also follow instruction sheet, as given.    Follow-Up: At [facility], you and your health needs are our priority.  As part of our continuing mission to provide you with exceptional heart care, we have created designated Provider Care Teams.  These Care Teams include your primary Cardiologist (physician) and Advanced Practice Providers (APPs -  Physician Assistants and Nurse Practitioners) who all work together to provide you with the care you need, when you need it. . Your physician recommends that you schedule a follow-up appointment in 1 TO 2 MONTHS WITH DR HARDING. .   Any Other Special Instructions Will Be Listed Below (If Applicable).

## 2018-08-19 NOTE — Assessment & Plan Note (Signed)
Mostly, she notes her palpitations occur with rapid heart rates during exercise.  We talked about wearing a monitor, I think probably the better option will be to do a treadmill GXT to see what happens to her heart rate with peak exercise.  I suspect that this may be deconditioning related along with dehydration.  She says overall her symptoms have improved with increasing her hydration.

## 2018-08-22 ENCOUNTER — Telehealth (HOSPITAL_COMMUNITY): Payer: Self-pay

## 2018-08-22 NOTE — Telephone Encounter (Signed)
Encounter complete. 

## 2018-08-27 ENCOUNTER — Encounter (HOSPITAL_COMMUNITY): Payer: Self-pay | Admitting: *Deleted

## 2018-08-27 ENCOUNTER — Ambulatory Visit (HOSPITAL_COMMUNITY)
Admission: RE | Admit: 2018-08-27 | Discharge: 2018-08-27 | Disposition: A | Discharge status: Home / Self Care | Payer: 59 | Source: Ambulatory Visit | Attending: Cardiovascular Disease | Admitting: Cardiovascular Disease

## 2018-08-27 DIAGNOSIS — R000 Tachycardia, unspecified: Secondary | ICD-10-CM | POA: Insufficient documentation

## 2018-08-27 DIAGNOSIS — R002 Palpitations: Secondary | ICD-10-CM | POA: Diagnosis not present

## 2018-08-27 LAB — EXERCISE TOLERANCE TEST
CHL CUP RESTING HR STRESS: 41 {beats}/min
CHL RATE OF PERCEIVED EXERTION: 14
CSEPED: 16 min
CSEPEDS: 33 s
CSEPHR: 100 %
Estimated workload: 19.5 METS
MPHR: 180 {beats}/min
Peak HR: 181 {beats}/min

## 2018-08-27 NOTE — Progress Notes (Signed)
Test reviewed by Dr. Allyson SabalBerry who said pt may leave.

## 2018-10-17 ENCOUNTER — Ambulatory Visit: Payer: 59 | Admitting: Cardiology

## 2019-05-22 IMAGING — CR DG CHEST 2V
2 series · 2 of 2 positions shown · non-contrast
Comparison: None.

CLINICAL DATA: Chest pain

EXAM:
CHEST - 2 VIEW

[w chest pa]
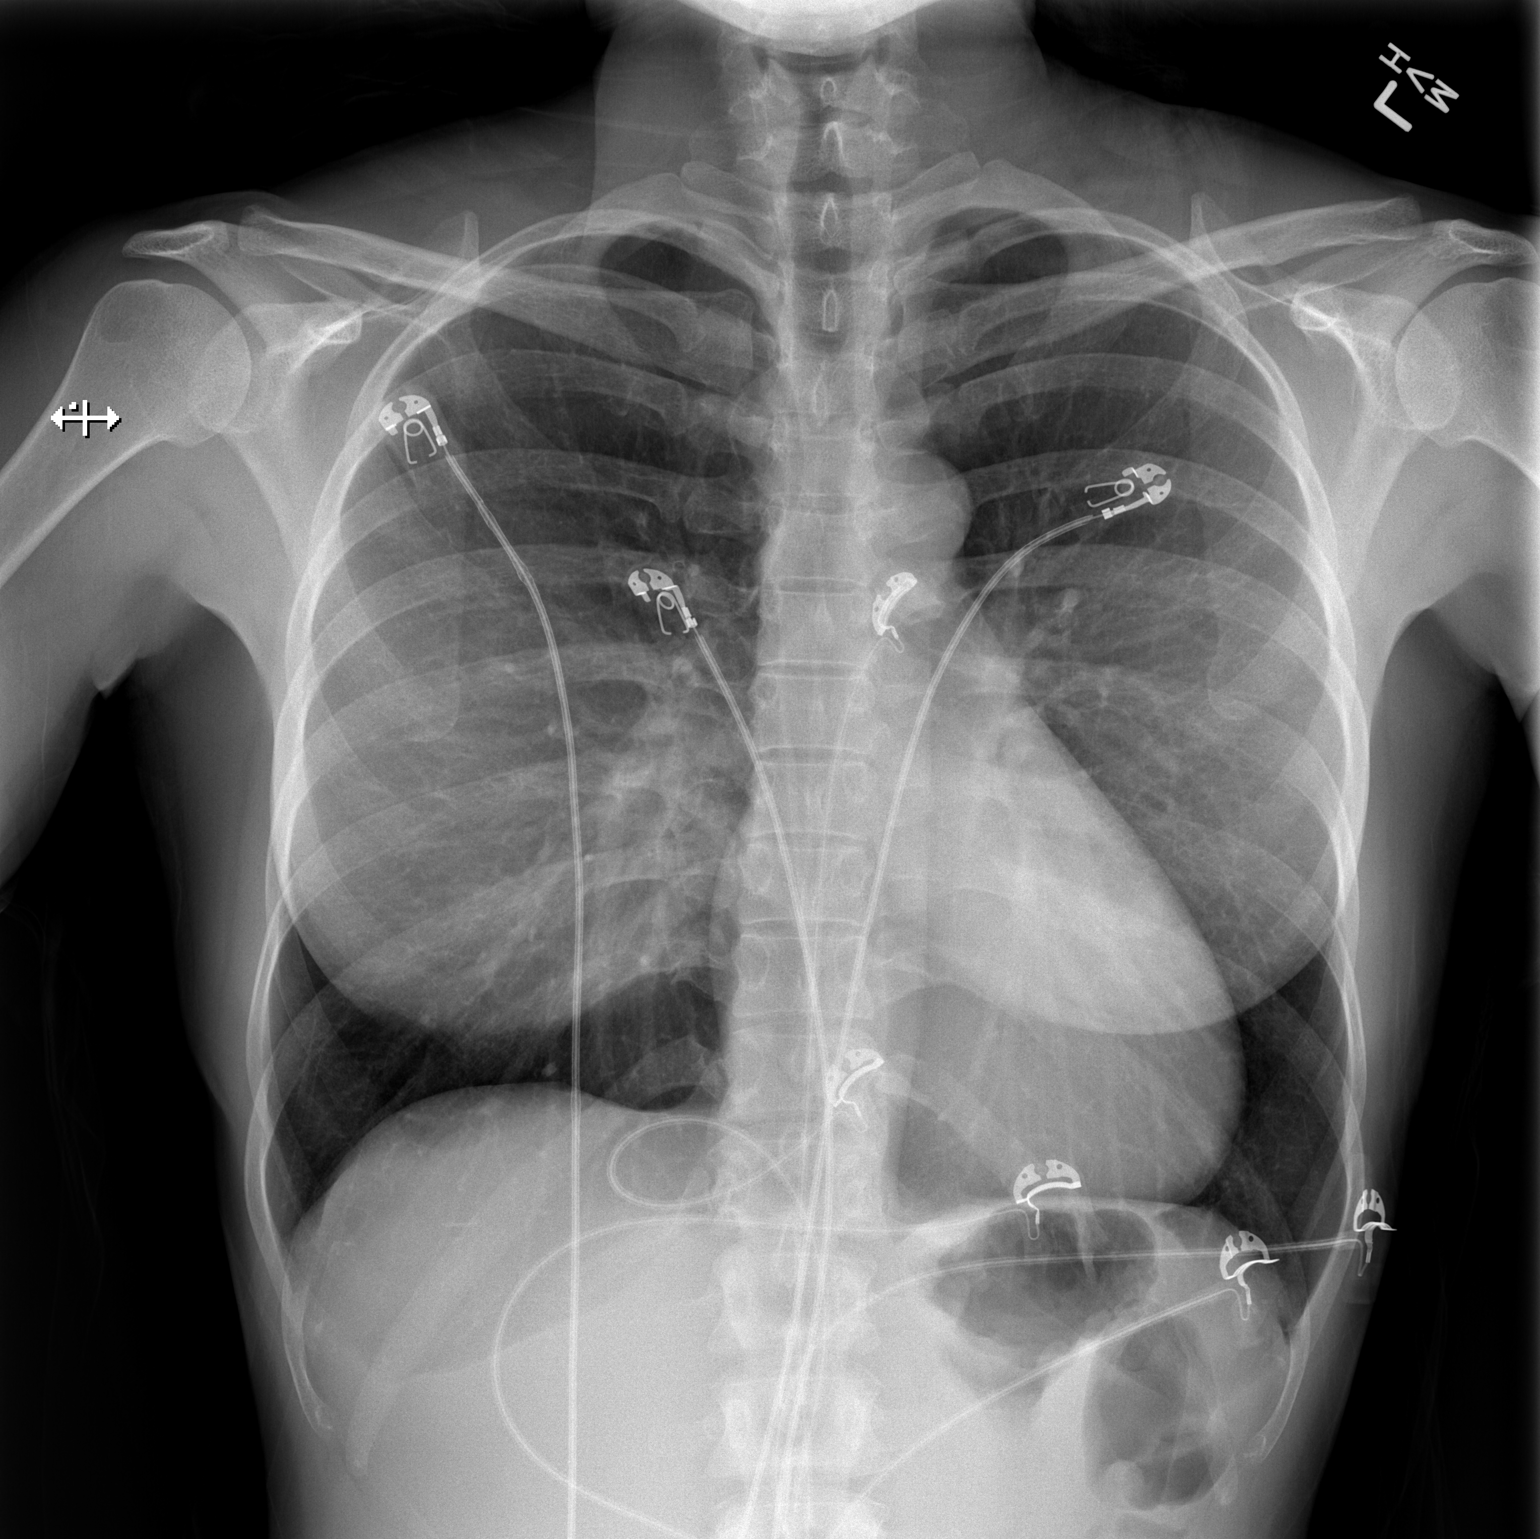

[w chest lat]
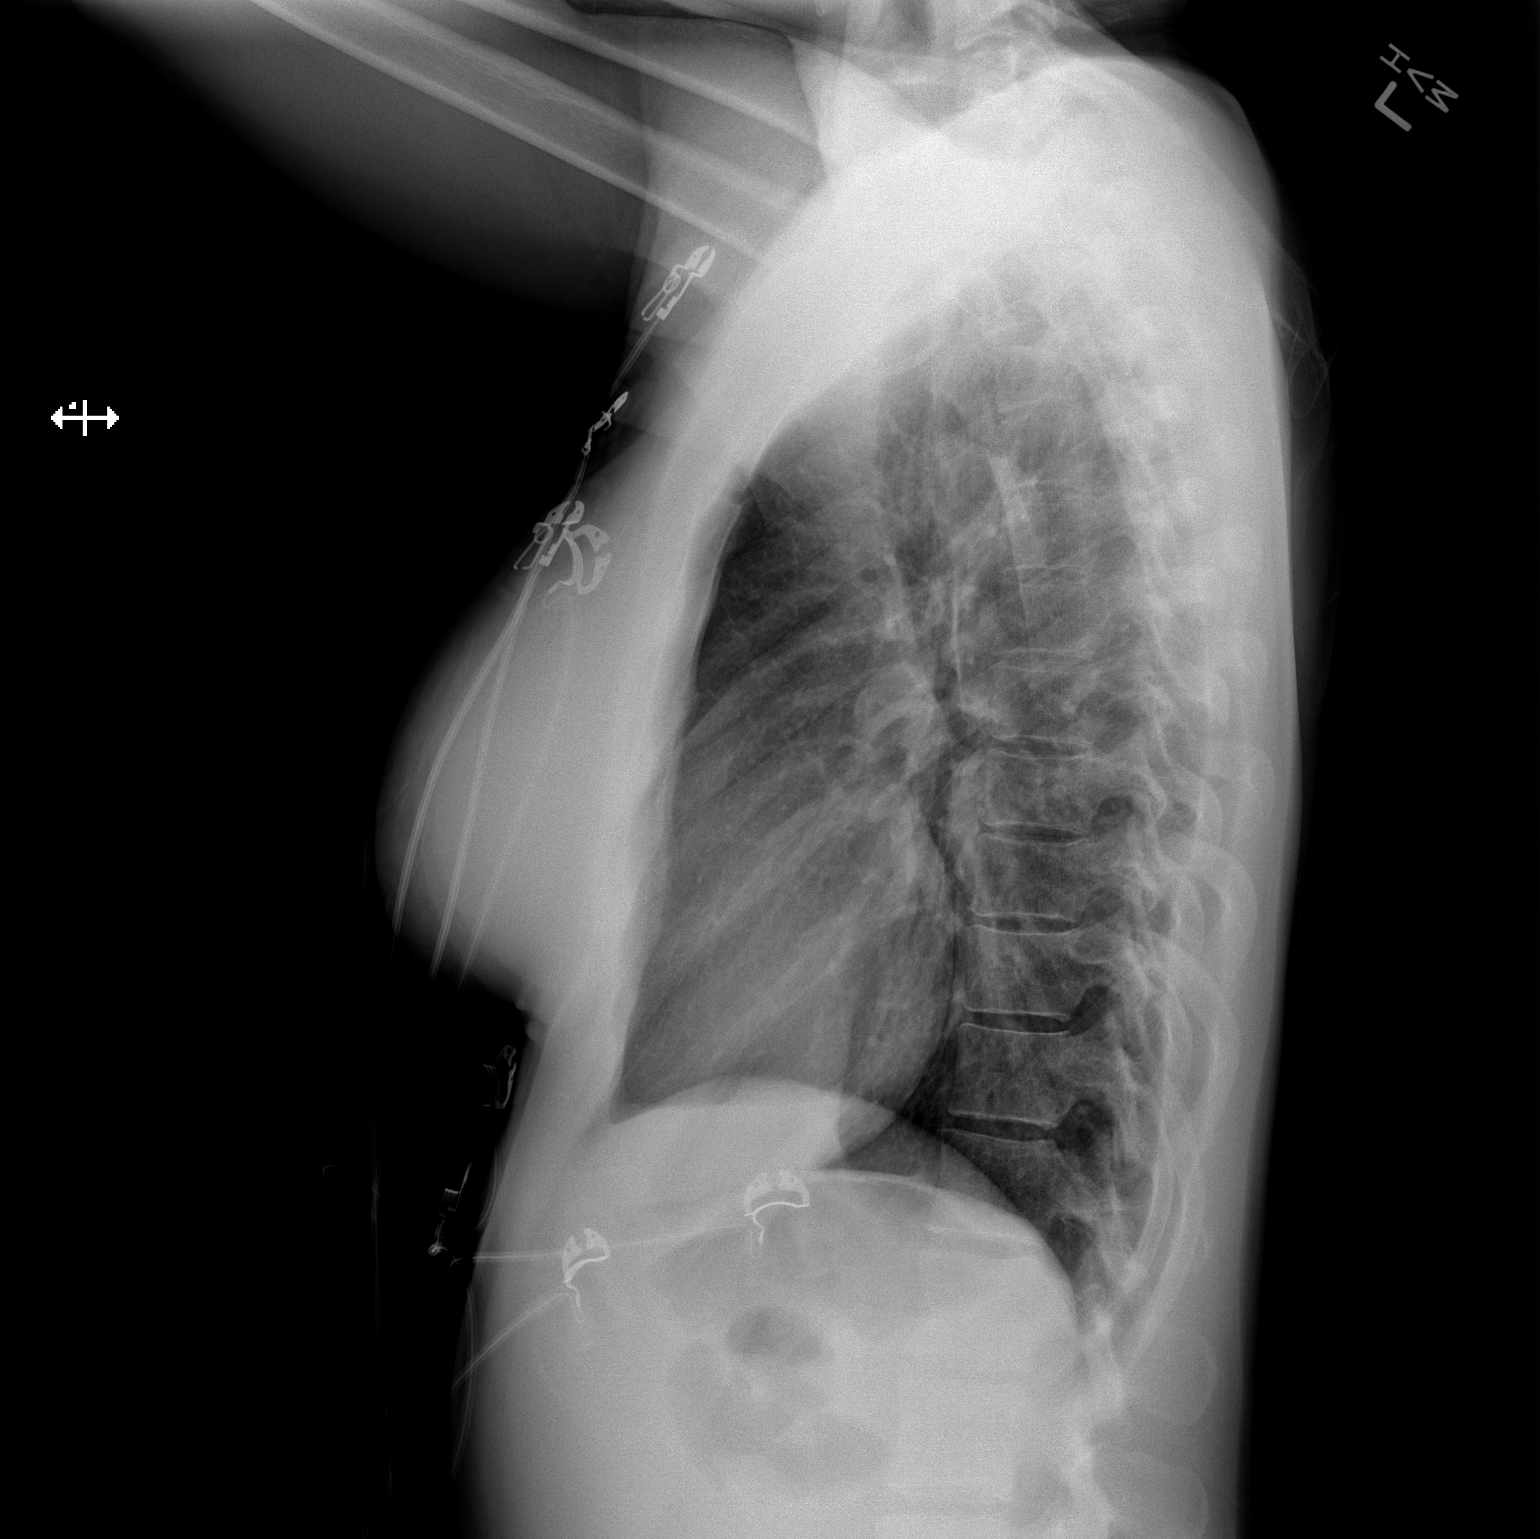

[2 of 2 positions shown; findings below may reference images not displayed]

FINDINGS: The heart size and mediastinal contours are within normal limits.
Both lungs are clear. The visualized skeletal structures are
unremarkable.
IMPRESSION: Clear lungs.

## 2019-07-25 ENCOUNTER — Other Ambulatory Visit: Payer: Self-pay

## 2019-07-25 DIAGNOSIS — Z20822 Contact with and (suspected) exposure to covid-19: Secondary | ICD-10-CM

## 2019-07-27 LAB — NOVEL CORONAVIRUS, NAA: SARS-CoV-2, NAA: NOT DETECTED

## 2019-08-06 ENCOUNTER — Other Ambulatory Visit: Payer: Self-pay

## 2019-08-06 DIAGNOSIS — Z20822 Contact with and (suspected) exposure to covid-19: Secondary | ICD-10-CM

## 2019-08-07 LAB — NOVEL CORONAVIRUS, NAA: SARS-CoV-2, NAA: NOT DETECTED
